# Patient Record
Sex: Male | Born: 1995 | Race: White | Hispanic: No | Marital: Single | Smoking: Never smoker
Health system: Southern US, Community
[De-identification: ages and names within clinical notes are randomized; demographics above are authoritative.]

## PROBLEM LIST (undated history)

## (undated) DIAGNOSIS — J45909 Unspecified asthma, uncomplicated: Secondary | ICD-10-CM

## (undated) DIAGNOSIS — J189 Pneumonia, unspecified organism: Secondary | ICD-10-CM

## (undated) HISTORY — PX: WISDOM TOOTH EXTRACTION: SHX21

---
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
## 2014-08-31 ENCOUNTER — Encounter (HOSPITAL_COMMUNITY): Payer: Self-pay | Admitting: *Deleted

## 2014-08-31 MED ORDER — CEFAZOLIN SODIUM-DEXTROSE 2-3 GM-% IV SOLR
2.0000 g | INTRAVENOUS | Status: AC
  Administered 2014-09-01: 2 g via INTRAVENOUS
  Filled 2014-08-31: qty 50

## 2014-08-31 MED ORDER — ACETAMINOPHEN 500 MG PO TABS: 1000.0000 mg | ORAL_TABLET | Freq: Once | ORAL | Status: DC

## 2014-08-31 MED ORDER — LACTATED RINGERS IV SOLN: INTRAVENOUS | Status: DC

## 2014-09-01 ENCOUNTER — Ambulatory Visit (HOSPITAL_COMMUNITY): Payer: PRIVATE HEALTH INSURANCE | Admitting: Certified Registered Nurse Anesthetist

## 2014-09-01 ENCOUNTER — Encounter (HOSPITAL_COMMUNITY): Payer: Self-pay

## 2014-09-01 ENCOUNTER — Ambulatory Visit (HOSPITAL_COMMUNITY)
Admission: RE | Admit: 2014-09-01 | Discharge: 2014-09-01 | Disposition: A | Discharge status: Home / Self Care | Payer: PRIVATE HEALTH INSURANCE | Source: Ambulatory Visit | Attending: Orthopedic Surgery | Admitting: Orthopedic Surgery

## 2014-09-01 ENCOUNTER — Ambulatory Visit (HOSPITAL_COMMUNITY): Payer: PRIVATE HEALTH INSURANCE

## 2014-09-01 ENCOUNTER — Encounter (HOSPITAL_COMMUNITY)
Admission: RE | Disposition: A | Discharge status: Home / Self Care | Payer: Self-pay | Source: Ambulatory Visit | Attending: Orthopedic Surgery

## 2014-09-01 DIAGNOSIS — X58XXXD Exposure to other specified factors, subsequent encounter: Secondary | ICD-10-CM | POA: Diagnosis not present

## 2014-09-01 DIAGNOSIS — S52201K Unspecified fracture of shaft of right ulna, subsequent encounter for closed fracture with nonunion: Secondary | ICD-10-CM | POA: Diagnosis present

## 2014-09-01 DIAGNOSIS — Z419 Encounter for procedure for purposes other than remedying health state, unspecified: Secondary | ICD-10-CM

## 2014-09-01 DIAGNOSIS — S52209A Unspecified fracture of shaft of unspecified ulna, initial encounter for closed fracture: Secondary | ICD-10-CM

## 2014-09-01 DIAGNOSIS — S52291K Other fracture of shaft of right ulna, subsequent encounter for closed fracture with nonunion: Secondary | ICD-10-CM | POA: Insufficient documentation

## 2014-09-01 HISTORY — PX: ORIF ULNAR FRACTURE: SHX5417

## 2014-09-01 HISTORY — DX: Unspecified asthma, uncomplicated: J45.909

## 2014-09-01 HISTORY — DX: Pneumonia, unspecified organism: J18.9

## 2014-09-01 LAB — CBC
HCT: 43.3 % (ref 39.0–52.0)
Hemoglobin: 14.3 g/dL (ref 13.0–17.0)
MCH: 26.5 pg (ref 26.0–34.0)
MCHC: 33 g/dL (ref 30.0–36.0)
MCV: 80.3 fL (ref 78.0–100.0)
PLATELETS: 265 10*3/uL (ref 150–400)
RBC: 5.39 MIL/uL (ref 4.22–5.81)
RDW: 13.4 % (ref 11.5–15.5)
WBC: 9.8 10*3/uL (ref 4.0–10.5)

## 2014-09-01 SURGERY — OPEN REDUCTION INTERNAL FIXATION (ORIF) ULNAR FRACTURE
Anesthesia: Regional | Site: Arm Lower | Laterality: Right

## 2014-09-01 MED ORDER — TRAMADOL HCL 50 MG PO TABS: 50.0000 mg | ORAL_TABLET | Freq: Four times a day (QID) | ORAL | Status: DC | PRN

## 2014-09-01 MED ORDER — PROPOFOL INFUSION 10 MG/ML OPTIME
INTRAVENOUS | Status: DC | PRN
  Administered 2014-09-01: 50 ug/kg/min via INTRAVENOUS

## 2014-09-01 MED ORDER — 0.9 % SODIUM CHLORIDE (POUR BTL) OPTIME
TOPICAL | Status: DC | PRN
  Administered 2014-09-01: 1000 mL

## 2014-09-01 MED ORDER — BUPIVACAINE-EPINEPHRINE (PF) 0.5% -1:200000 IJ SOLN
INTRAMUSCULAR | Status: DC | PRN
  Administered 2014-09-01: 25 mL via PERINEURAL

## 2014-09-01 MED ORDER — PROPOFOL 10 MG/ML IV BOLUS
INTRAVENOUS | Status: AC
  Filled 2014-09-01: qty 20

## 2014-09-01 MED ORDER — FENTANYL CITRATE (PF) 250 MCG/5ML IJ SOLN
INTRAMUSCULAR | Status: AC
  Filled 2014-09-01: qty 5

## 2014-09-01 MED ORDER — ACETAMINOPHEN 500 MG PO TABS: 500.0000 mg | ORAL_TABLET | Freq: Four times a day (QID) | ORAL | Status: DC | PRN

## 2014-09-01 MED ORDER — FENTANYL CITRATE (PF) 100 MCG/2ML IJ SOLN
INTRAMUSCULAR | Status: DC | PRN
  Administered 2014-09-01: 100 ug via INTRAVENOUS

## 2014-09-01 MED ORDER — PROMETHAZINE HCL 12.5 MG PO TABS: 12.5000 mg | ORAL_TABLET | Freq: Four times a day (QID) | ORAL | Status: DC | PRN

## 2014-09-01 MED ORDER — MIDAZOLAM HCL 5 MG/5ML IJ SOLN
INTRAMUSCULAR | Status: DC | PRN
  Administered 2014-09-01: 2 mg via INTRAVENOUS

## 2014-09-01 MED ORDER — CHLORHEXIDINE GLUCONATE 4 % EX LIQD: 60.0000 mL | Freq: Once | CUTANEOUS | Status: DC

## 2014-09-01 MED ORDER — LACTATED RINGERS IV SOLN
INTRAVENOUS | Status: DC | PRN
  Administered 2014-09-01 (×2): via INTRAVENOUS

## 2014-09-01 MED ORDER — ONDANSETRON HCL 4 MG/2ML IJ SOLN
INTRAMUSCULAR | Status: DC | PRN
  Administered 2014-09-01: 4 mg via INTRAVENOUS

## 2014-09-01 MED ORDER — MIDAZOLAM HCL 2 MG/2ML IJ SOLN
INTRAMUSCULAR | Status: AC
  Filled 2014-09-01: qty 2

## 2014-09-01 MED ORDER — OXYCODONE HCL 5 MG PO TABS: 5.0000 mg | ORAL_TABLET | Freq: Four times a day (QID) | ORAL | Status: DC | PRN

## 2014-09-01 SURGICAL SUPPLY — 67 items
BANDAGE ELASTIC 3 VELCRO ST LF (GAUZE/BANDAGES/DRESSINGS) ×2 IMPLANT
BANDAGE ELASTIC 4 VELCRO ST LF (GAUZE/BANDAGES/DRESSINGS) ×3 IMPLANT
BIT DRILL 2.5X110 QC LCP DISP (BIT) ×2 IMPLANT
BLADE SURG ROTATE 9660 (MISCELLANEOUS) ×3 IMPLANT
BNDG CMPR 9X4 STRL LF SNTH (GAUZE/BANDAGES/DRESSINGS) ×1
BNDG ESMARK 4X9 LF (GAUZE/BANDAGES/DRESSINGS) ×3 IMPLANT
BNDG GAUZE ELAST 4 BULKY (GAUZE/BANDAGES/DRESSINGS) ×2 IMPLANT
BRUSH SCRUB DISP (MISCELLANEOUS) ×6 IMPLANT
COVER SURGICAL LIGHT HANDLE (MISCELLANEOUS) ×6 IMPLANT
CUFF TOURNIQUET SINGLE 24IN (TOURNIQUET CUFF) ×2 IMPLANT
DECANTER SPIKE VIAL GLASS SM (MISCELLANEOUS) IMPLANT
DRAPE C-ARM 42X72 X-RAY (DRAPES) ×2 IMPLANT
DRAPE C-ARMOR (DRAPES) ×1 IMPLANT
DRAPE OEC MINIVIEW 54X84 (DRAPES) ×1 IMPLANT
DRAPE U-SHAPE 47X51 STRL (DRAPES) ×2 IMPLANT
DRSG ADAPTIC 3X8 NADH LF (GAUZE/BANDAGES/DRESSINGS) ×2 IMPLANT
DRSG EMULSION OIL 3X3 NADH (GAUZE/BANDAGES/DRESSINGS) ×1 IMPLANT
ELECT REM PT RETURN 9FT ADLT (ELECTROSURGICAL) ×3
ELECTRODE REM PT RTRN 9FT ADLT (ELECTROSURGICAL) ×1 IMPLANT
GAUZE SPONGE 4X4 12PLY STRL (GAUZE/BANDAGES/DRESSINGS) ×3 IMPLANT
GLOVE BIO SURGEON STRL SZ7.5 (GLOVE) ×3 IMPLANT
GLOVE BIO SURGEON STRL SZ8 (GLOVE) ×3 IMPLANT
GLOVE BIOGEL PI IND STRL 7.5 (GLOVE) ×1 IMPLANT
GLOVE BIOGEL PI IND STRL 8 (GLOVE) ×1 IMPLANT
GLOVE BIOGEL PI INDICATOR 7.5 (GLOVE) ×2
GLOVE BIOGEL PI INDICATOR 8 (GLOVE) ×2
GOWN STRL REUS W/ TWL LRG LVL3 (GOWN DISPOSABLE) ×2 IMPLANT
GOWN STRL REUS W/ TWL XL LVL3 (GOWN DISPOSABLE) ×1 IMPLANT
GOWN STRL REUS W/TWL LRG LVL3 (GOWN DISPOSABLE) ×6
GOWN STRL REUS W/TWL XL LVL3 (GOWN DISPOSABLE) ×3
KIT BASIN OR (CUSTOM PROCEDURE TRAY) ×3 IMPLANT
KIT ROOM TURNOVER OR (KITS) ×3 IMPLANT
MANIFOLD NEPTUNE II (INSTRUMENTS) ×1 IMPLANT
NDL HYPO 25GX1X1/2 BEV (NEEDLE) IMPLANT
NEEDLE HYPO 25GX1X1/2 BEV (NEEDLE) IMPLANT
NS IRRIG 1000ML POUR BTL (IV SOLUTION) ×3 IMPLANT
PACK ORTHO EXTREMITY (CUSTOM PROCEDURE TRAY) ×3 IMPLANT
PAD ARMBOARD 7.5X6 YLW CONV (MISCELLANEOUS) ×4 IMPLANT
PAD CAST 3X4 CTTN HI CHSV (CAST SUPPLIES) ×1 IMPLANT
PAD CAST 4YDX4 CTTN HI CHSV (CAST SUPPLIES) IMPLANT
PADDING CAST COTTON 3X4 STRL (CAST SUPPLIES) ×3
PADDING CAST COTTON 4X4 STRL (CAST SUPPLIES) ×6
PROS LCP PLATE 8H 111M (Plate) ×3 IMPLANT
PROSTHESIS LCP PLATE 8H 111M (Plate) IMPLANT
SCREW CORTEX 3.5 16MM (Screw) ×2 IMPLANT
SCREW CORTEX 3.5 20MM (Screw) ×10 IMPLANT
SCREW LOCK CORT ST 3.5X16 (Screw) IMPLANT
SCREW LOCK CORT ST 3.5X20 (Screw) IMPLANT
SPLINT PLASTER CAST XFAST 4X15 (CAST SUPPLIES) IMPLANT
SPLINT PLASTER XTRA FAST SET 4 (CAST SUPPLIES) ×2
SPONGE SCRUB IODOPHOR (GAUZE/BANDAGES/DRESSINGS) ×3 IMPLANT
STAPLER VISISTAT 35W (STAPLE) ×1 IMPLANT
SUCTION FRAZIER TIP 10 FR DISP (SUCTIONS) ×3 IMPLANT
SUT ETHILON 3 0 PS 1 (SUTURE) ×4 IMPLANT
SUT PROLENE 0 CT (SUTURE) IMPLANT
SUT VIC AB 0 CT1 27 (SUTURE) ×3
SUT VIC AB 0 CT1 27XBRD ANBCTR (SUTURE) IMPLANT
SUT VIC AB 2-0 CT1 27 (SUTURE) ×3
SUT VIC AB 2-0 CT1 TAPERPNT 27 (SUTURE) ×1 IMPLANT
SUT VIC AB 2-0 CT3 27 (SUTURE) IMPLANT
SYR CONTROL 10ML LL (SYRINGE) IMPLANT
TOWEL OR 17X24 6PK STRL BLUE (TOWEL DISPOSABLE) ×3 IMPLANT
TOWEL OR 17X26 10 PK STRL BLUE (TOWEL DISPOSABLE) ×6 IMPLANT
TUBE CONNECTING 12'X1/4 (SUCTIONS) ×1
TUBE CONNECTING 12X1/4 (SUCTIONS) ×2 IMPLANT
UNDERPAD 30X30 INCONTINENT (UNDERPADS AND DIAPERS) ×3 IMPLANT
WATER STERILE IRR 1000ML POUR (IV SOLUTION) ×1 IMPLANT

## 2014-09-01 NOTE — Anesthesia Preprocedure Evaluation (Signed)
Anesthesia Evaluation  Patient identified by MRN, date of birth, ID band Patient awake    Reviewed: Allergy & Precautions, NPO status , Patient's Chart, lab work & pertinent test results  Airway Mallampati: II  TM Distance: >3 FB Neck ROM: Full    Dental no notable dental hx.    Pulmonary asthma , pneumonia -,  breath sounds clear to auscultation  Pulmonary exam normal       Cardiovascular negative cardio ROS Normal cardiovascular examRhythm:Regular Rate:Normal     Neuro/Psych negative neurological ROS  negative psych ROS   GI/Hepatic negative GI ROS, Neg liver ROS,   Endo/Other  negative endocrine ROS  Renal/GU negative Renal ROS     Musculoskeletal negative musculoskeletal ROS (+)   Abdominal   Peds  Hematology negative hematology ROS (+)   Anesthesia Other Findings   Reproductive/Obstetrics negative OB ROS                             Anesthesia Physical Anesthesia Plan  ASA: II  Anesthesia Plan: General and Regional   Post-op Pain Management:    Induction: Intravenous  Airway Management Planned: LMA  Additional Equipment:   Intra-op Plan:   Post-operative Plan: Extubation in OR  Informed Consent: I have reviewed the patients History and Physical, chart, labs and discussed the procedure including the risks, benefits and alternatives for the proposed anesthesia with the patient or authorized representative who has indicated his/her understanding and acceptance.   Dental advisory given  Plan Discussed with: CRNA  Anesthesia Plan Comments:         Anesthesia Quick Evaluation

## 2014-09-01 NOTE — Brief Op Note (Signed)
09/01/2014  9:59 AM  PATIENT:  Craig Norman  19 y.o. male  PRE-OPERATIVE DIAGNOSIS:  RIGHT ULNAR NON UNION  POST-OPERATIVE DIAGNOSIS:  RIGHT ULNAR NON UNION  PROCEDURE:  Procedure(s): OPEN REDUCTION INTERNAL FIXATION (ORIF) RIGHT ULNAR FRACTURE (Right)  SURGEON:  Surgeon(s) and Role:    * Myrene Galas, MD - Primary  PHYSICIAN ASSISTANT: Montez Morita, PA-C  ANESTHESIA:   regional  I/O:  Total I/O In: 1000 [I.V.:1000] Out: 50 [Blood:50]  SPECIMEN:  No Specimen  TOURNIQUET:  None  DICTATION: .Other Dictation: Dictation Number [id number]

## 2014-09-01 NOTE — Anesthesia Procedure Notes (Signed)
Anesthesia Regional Block:  Supraclavicular block  Pre-Anesthetic Checklist: ,, timeout performed, Correct Patient, Correct Site, Correct Laterality, Correct Procedure, Correct Position, site marked, Risks and benefits discussed,  Surgical consent,  Pre-op evaluation,  At surgeon's request and post-op pain management  Laterality: Upper  Prep: chloraprep       Needles:  Injection technique: Single-shot  Needle Type: Echogenic Stimulator Needle          Additional Needles:  Procedures: ultrasound guided (picture in chart) Supraclavicular block Narrative:  Injection made incrementally with aspirations every 5 mL.  Performed by: Personally   Additional Notes: H+P and labs reviewed, risks and benefits discussed with patient, procedure tolerated well without complications

## 2014-09-01 NOTE — Transfer of Care (Signed)
Immediate Anesthesia Transfer of Care Note  Patient: Craig Norman  Procedure(s) Performed: Procedure(s): OPEN REDUCTION INTERNAL FIXATION (ORIF) RIGHT ULNAR FRACTURE (Right)  Patient Location: PACU  Anesthesia Type:MAC and Regional  Level of Consciousness: patient cooperative  Airway & Oxygen Therapy: Patient Spontanous Breathing and Patient connected to nasal cannula oxygen  Post-op Assessment: Report given to RN and Post -op Vital signs reviewed and stable  Post vital signs: Reviewed and stable  Last Vitals:  Filed Vitals:   09/01/14 0609  BP: 125/70  Pulse: 68  Temp: 36.6 C  Resp: 18    Complications: No apparent anesthesia complications

## 2014-09-01 NOTE — Anesthesia Postprocedure Evaluation (Signed)
  Anesthesia Post-op Note  Patient: Craig Norman  Procedure(s) Performed: Procedure(s): OPEN REDUCTION INTERNAL FIXATION (ORIF) RIGHT ULNAR FRACTURE (Right)  Patient Location: PACU  Anesthesia Type:MAC and Regional  Level of Consciousness: awake  Airway and Oxygen Therapy: Patient Spontanous Breathing  Post-op Pain: none  Post-op Assessment: Post-op Vital signs reviewed, Patient's Cardiovascular Status Stable, Respiratory Function Stable, Patent Airway, No signs of Nausea or vomiting and Pain level controlled              Post-op Vital Signs: Reviewed and stable  Last Vitals:  Filed Vitals:   09/01/14 1125  BP: 108/65  Pulse: 69  Temp: 36.2 C  Resp: 16    Complications: No apparent anesthesia complications

## 2014-09-01 NOTE — Progress Notes (Signed)
Report given to meagen rn as caregiver

## 2014-09-01 NOTE — H&P (Signed)
Orthopaedic Trauma Service H&P   Chief Complaint: symptomatic nonunion R ulna fracture HPI:   19 y/o RHD male sustained a closed fracture of his right ulna approximately 10 weeks ago.  Pt was seen at another orthopaedic office and treated nonoperatively. He was in a long arm cast for 2 weeks followed by removable brace for 2 weeks and then released for full activity. Pt has had unrelenting pain and weakness in his R arm. 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  Options were discussed with the pt and family and he decided to proceed with surgical intervention. Pt presents today for ORIF R ulna nonunion   Past Medical History  Diagnosis Date  . Asthma     exercise induced  . Pneumonia 2012ish    Past Surgical History  Procedure Laterality Date  . Wisdom tooth extraction      History reviewed. No pertinent family history. Social History:  reports that he has never smoked. He does not have any smokeless tobacco history on file. He reports that he does not drink alcohol or use illicit drugs.  Allergies: No Known Allergies  Medications Prior to Admission  Medication Sig Dispense Refill  . albuterol (PROVENTIL HFA;VENTOLIN HFA) 108 (90 BASE) MCG/ACT inhaler Inhale 1-2 puffs into the lungs every 6 (six) hours as needed for wheezing or shortness of breath.     . montelukast (SINGULAIR) 10 MG tablet Take 10 mg by mouth at bedtime as needed (allergies). Takes prn      Results for orders placed or performed during the hospital encounter of 09/01/14 (from the past 48 hour(s))  CBC     Status: None   Collection Time: 09/01/14  6:39 AM  Result Value Ref Range   WBC 9.8 4.0 - 10.5 K/uL   RBC 5.39 4.22 - 5.81 MIL/uL   Hemoglobin 14.3 13.0 - 17.0 g/dL   HCT 16.1 09.6 - 04.5 %   MCV 80.3 78.0 - 100.0 fL   MCH 26.5 26.0 - 34.0 pg   MCHC 33.0 30.0 - 36.0 g/dL   RDW 40.9 81.1 - 91.4 %   Platelets 265 150 - 400 K/uL   No  results found.  Review of Systems  Constitutional: Negative for fever and chills.  Eyes: Negative for blurred vision.  Respiratory: Negative for shortness of breath.   Cardiovascular: Negative for chest pain and palpitations.  Gastrointestinal: Negative for nausea, vomiting and abdominal pain.  Neurological: Negative for headaches.    Blood pressure 125/70, pulse 68, temperature 97.8 F (36.6 C), temperature source Oral, resp. rate 18, height  (1.702 m), weight 64.864 kg (143 lb), SpO2 100 %. Physical Exam  Constitutional: He is oriented to person, place, and time. He appears well-developed and well-nourished. He is cooperative. No distress.  HENT:  Head: Normocephalic and atraumatic.  Eyes: Pupils are equal, round, and reactive to light.  Neck: Normal range of motion.  Cardiovascular: Normal rate, regular rhythm, S1 normal and S2 normal.   No murmur heard. Respiratory: Effort normal and breath sounds normal. No respiratory distress. He has no wheezes.  GI: Soft. Bowel sounds are normal. He exhibits no distension.  Musculoskeletal:  R upper extremity    Full elbow, forearm and wrist ROM   + deformity over fracture site    TTP over fx site     No gross motion with manipulation of forearm at fracture site    Ext  warm     + radial pulse    R/U/M motor and sensory functions intact    AIN and PIN motor functions intact    Pain with MMT at fracture site as well    Hand and shoulder unremarkable   Neurological: He is alert and oriented to person, place, and time.  Skin: Skin is warm and dry. No rash noted.  Psychiatric: He has a normal mood and affect. His behavior is normal. Thought content normal.    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

## 2014-09-02 NOTE — Op Note (Signed)
NAME:  COTEY, HAM NO.:  [id number]  MEDICAL RECORD NO.:  [id number]  LOCATION:  [facility]                         FACILITY:  [facility]  PHYSICIAN:  Doralee Albino. Carola Frost, M.D. DATE OF BIRTH:  07/17/1995  DATE OF PROCEDURE:  09/01/2014 DATE OF DISCHARGE:  09/01/2014                              OPERATIVE REPORT   PREOPERATIVE DIAGNOSIS:  Right ulna, nonunion.  POSTOPERATIVE DIAGNOSIS:  Right ulna, nonunion.  PROCEDURE:  Repair of right ulna nonunion with compression plating and local autogenous bone grafting.  SURGEON:  Doralee Albino. Carola Frost, M.D.  ASSISTANT:  Mearl Latin, PA-C.  ANESTHESIA:  Supraclavicular block supplemented with MAC.  TOURNIQUET:  None.  COMPLICATIONS:  None.  ESTIMATED BLOOD LOSS:  Less than 50 mL.  DISPOSITION:  PACU.  CONDITION:  Stable.  BRIEF SUMMARY AND INDICATION FOR PROCEDURE:  Craig Norman is a very pleasant 19 year old right-hand dominant male, who sustained an ulnar shaft fracture in [employer] [facility] training.  He went on to develop a hypertrophic nonunion.  He had been followed by Dr. Dannielle Huh who felt that this would be best managed by fellowship trained orthopedic traumatologist and was outside of his regular practice and consequently, the patient sought definitive management with [place].  I discussed with him the risks and benefits of surgical repair, the options for allograft versus autograft and alternative means for fixation as well as the potential for symptomatic hardware infection, nerve injury, vessel injury, loss of motion, need for further surgery, persistent nonunion, many others.  The patient acknowledged these risks and did wish to proceed.  BRIEF SUMMARY OF PROCEDURE:  Lauchlan was given a supraclavicular block by Dr. Maple Hudson in preop holding with excellent relief.  He was then taken to the PACU, did receive 2 g of Ancef preoperatively.  His right upper extremity was prepped and draped in usual sterile fashion.   Tourniquet was placed about the arm, but never inflated during the procedure.  The ulna was then exposed through a subcutaneous border incision of approximately 7 cm.  I entered the volar compartment and was very careful to protect the muscle belly and to preserve as much periosteum as possible.  However, I did have to expose the volar aspect of the hypertrophic nonunion site.  I then used a 0.5 inch osteotome and 0.25 inch osteotome to remove a channel of bone and make this flushing congruent with the rest of the ulna, so that I could place a 3.5 LC-DCP plate from [employer].  This was secured into position with 1 screw and checked on AP and lateral projections to make sure, we were pleased with both the length and alignment.  We then placed a series of the screws and compression, drilling eccentrically and tightening on both sides of the fracture for double compression.  Final images showed appropriate reduction, hardware length and trajectory.  The bone which had been chiseled a way was morselized using the cancellous portions or callus, and then were placed into the area of nonunion and around it.  There were no large chiasm or significant diastasis.  I did probe the fracture quite aggressively with a 15 blade and it was really about this width that  the nonunion persisted.  This was the course primary area of graft placement as well.  The fascia was left open, but tacked to approximate and then the subcu closed with 2-0 Vicryl and 3-0 nylon used for the skin.  Sterile gentle compressive dressing and volar splint from the elbow and past the wrist was applied.  The patient was awakened from anesthesia, transported to PACU in stable condition.  Montez Morita, PA-C did assist me throughout and was necessary for retraction and hardware placement and also assisted with closure.  PROGNOSIS:  Neno will be in the splint.  He will return to the office in 10 days for removal of his sutures and  splint.  At that time, we do not anticipate any further bracing.  He will be allowed to return to play and contact at 6 weeks pending examination and x-rays.  Although, ulnar plates are frequently symptomatic following repair, we are hopeful that the volar placement will mitigate the need for removal, but in this young patient certainly feasible.     Doralee Albino. Carola Frost, M.D.     MHH/MEDQ  D:  09/01/2014  T:  09/02/2014  Job:  [id number]

## 2014-09-04 ENCOUNTER — Encounter (HOSPITAL_COMMUNITY): Payer: Self-pay | Admitting: Orthopedic Surgery

## 2015-01-29 ENCOUNTER — Encounter (HOSPITAL_COMMUNITY): Payer: Self-pay | Admitting: *Deleted

## 2015-01-29 MED ORDER — CEFAZOLIN SODIUM-DEXTROSE 2-3 GM-% IV SOLR
2.0000 g | INTRAVENOUS | Status: AC
  Administered 2015-01-30: 2 g via INTRAVENOUS
  Filled 2015-01-29: qty 50
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
## 2015-01-29 NOTE — Anesthesia Preprocedure Evaluation (Signed)
Anesthesia Evaluation  Patient identified by MRN, date of birth, ID band Patient awake    Reviewed: Allergy & Precautions, NPO status , Patient's Chart, lab work & pertinent test results  Airway Mallampati: II  TM Distance: >3 FB Neck ROM: Full    Dental no notable dental hx.    Pulmonary asthma , pneumonia,    Pulmonary exam normal breath sounds clear to auscultation       Cardiovascular negative cardio ROS Normal cardiovascular exam Rhythm:Regular Rate:Normal     Neuro/Psych negative neurological ROS  negative psych ROS   GI/Hepatic negative GI ROS, Neg liver ROS,   Endo/Other  negative endocrine ROS  Renal/GU negative Renal ROS     Musculoskeletal negative musculoskeletal ROS (+)   Abdominal   Peds  Hematology negative hematology ROS (+)   Anesthesia Other Findings   Reproductive/Obstetrics negative OB ROS                             Anesthesia Physical  Anesthesia Plan  ASA: II  Anesthesia Plan: General and Regional   Post-op Pain Management:    Induction: Intravenous  Airway Management Planned: LMA  Additional Equipment:   Intra-op Plan:   Post-operative Plan: Extubation in OR  Informed Consent: I have reviewed the patients History and Physical, chart, labs and discussed the procedure including the risks, benefits and alternatives for the proposed anesthesia with the patient or authorized representative who has indicated his/her understanding and acceptance.   Dental advisory given  Plan Discussed with: CRNA  Anesthesia Plan Comments:         Anesthesia Quick Evaluation

## 2015-01-29 NOTE — Progress Notes (Signed)
Called Dr. Magdalene PatriciaHandy's office for pre-op orders. Left message on office voicemail

## 2015-01-30 ENCOUNTER — Ambulatory Visit (HOSPITAL_COMMUNITY)
Admission: RE | Admit: 2015-01-30 | Discharge: 2015-01-30 | Disposition: A | Discharge status: Home / Self Care | Payer: PRIVATE HEALTH INSURANCE | Source: Ambulatory Visit | Attending: Orthopedic Surgery | Admitting: Orthopedic Surgery

## 2015-01-30 ENCOUNTER — Ambulatory Visit (HOSPITAL_COMMUNITY): Payer: PRIVATE HEALTH INSURANCE

## 2015-01-30 ENCOUNTER — Encounter (HOSPITAL_COMMUNITY)
Admission: RE | Disposition: A | Discharge status: Home / Self Care | Payer: Self-pay | Source: Ambulatory Visit | Attending: Orthopedic Surgery

## 2015-01-30 ENCOUNTER — Encounter (HOSPITAL_COMMUNITY): Payer: Self-pay | Admitting: Anesthesiology

## 2015-01-30 ENCOUNTER — Ambulatory Visit (HOSPITAL_COMMUNITY): Payer: PRIVATE HEALTH INSURANCE | Admitting: Anesthesiology

## 2015-01-30 DIAGNOSIS — Y9389 Activity, other specified: Secondary | ICD-10-CM | POA: Diagnosis not present

## 2015-01-30 DIAGNOSIS — T148XXA Other injury of unspecified body region, initial encounter: Secondary | ICD-10-CM

## 2015-01-30 DIAGNOSIS — Y838 Other surgical procedures as the cause of abnormal reaction of the patient, or of later complication, without mention of misadventure at the time of the procedure: Secondary | ICD-10-CM | POA: Insufficient documentation

## 2015-01-30 DIAGNOSIS — M96631 Fracture of radius or ulna following insertion of orthopedic implant, joint prosthesis, or bone plate, right arm: Secondary | ICD-10-CM | POA: Insufficient documentation

## 2015-01-30 DIAGNOSIS — J45909 Unspecified asthma, uncomplicated: Secondary | ICD-10-CM | POA: Diagnosis not present

## 2015-01-30 DIAGNOSIS — Z419 Encounter for procedure for purposes other than remedying health state, unspecified: Secondary | ICD-10-CM

## 2015-01-30 DIAGNOSIS — W500XXA Accidental hit or strike by another person, initial encounter: Secondary | ICD-10-CM | POA: Insufficient documentation

## 2015-01-30 HISTORY — PX: HARDWARE REMOVAL: SHX979

## 2015-01-30 HISTORY — PX: ORIF ULNAR FRACTURE: SHX5417

## 2015-01-30 LAB — PREALBUMIN: PREALBUMIN: 27.6 mg/dL (ref 18–38)

## 2015-01-30 LAB — CBC WITH DIFFERENTIAL/PLATELET
BASOS ABS: 0 10*3/uL (ref 0.0–0.1)
BASOS PCT: 0 %
EOS ABS: 0.2 10*3/uL (ref 0.0–0.7)
Eosinophils Relative: 2 %
HCT: 38.6 % — ABNORMAL LOW (ref 39.0–52.0)
Hemoglobin: 12.6 g/dL — ABNORMAL LOW (ref 13.0–17.0)
Lymphocytes Relative: 39 %
Lymphs Abs: 2.9 10*3/uL (ref 0.7–4.0)
MCH: 26.2 pg (ref 26.0–34.0)
MCHC: 32.6 g/dL (ref 30.0–36.0)
MCV: 80.2 fL (ref 78.0–100.0)
MONOS PCT: 10 %
Monocytes Absolute: 0.8 10*3/uL (ref 0.1–1.0)
NEUTROS PCT: 49 %
Neutro Abs: 3.5 10*3/uL (ref 1.7–7.7)
PLATELETS: 244 10*3/uL (ref 150–400)
RBC: 4.81 MIL/uL (ref 4.22–5.81)
RDW: 13 % (ref 11.5–15.5)
WBC: 7.4 10*3/uL (ref 4.0–10.5)

## 2015-01-30 LAB — COMPREHENSIVE METABOLIC PANEL
ALK PHOS: 56 U/L (ref 38–126)
ALT: 19 U/L (ref 17–63)
AST: 29 U/L (ref 15–41)
Albumin: 4 g/dL (ref 3.5–5.0)
Anion gap: 9 (ref 5–15)
BILIRUBIN TOTAL: 0.5 mg/dL (ref 0.3–1.2)
BUN: 17 mg/dL (ref 6–20)
CALCIUM: 9 mg/dL (ref 8.9–10.3)
CO2: 23 mmol/L (ref 22–32)
CREATININE: 1.16 mg/dL (ref 0.61–1.24)
Chloride: 106 mmol/L (ref 101–111)
Glucose, Bld: 83 mg/dL (ref 65–99)
Potassium: 3.6 mmol/L (ref 3.5–5.1)
SODIUM: 138 mmol/L (ref 135–145)
TOTAL PROTEIN: 6.5 g/dL (ref 6.5–8.1)

## 2015-01-30 LAB — TSH: TSH: 3.79 u[IU]/mL (ref 0.350–4.500)

## 2015-01-30 LAB — PHOSPHORUS: PHOSPHORUS: 5.2 mg/dL — AB (ref 2.5–4.6)

## 2015-01-30 LAB — MAGNESIUM: MAGNESIUM: 2 mg/dL (ref 1.7–2.4)

## 2015-01-30 SURGERY — OPEN REDUCTION INTERNAL FIXATION (ORIF) ULNAR FRACTURE
Anesthesia: Regional | Site: Arm Lower | Laterality: Right

## 2015-01-30 MED ORDER — DEXAMETHASONE SODIUM PHOSPHATE 4 MG/ML IJ SOLN
INTRAMUSCULAR | Status: AC
  Filled 2015-01-30: qty 2

## 2015-01-30 MED ORDER — OXYCODONE-ACETAMINOPHEN 5-325 MG PO TABS: 1.0000 | ORAL_TABLET | Freq: Four times a day (QID) | ORAL | Status: AC | PRN

## 2015-01-30 MED ORDER — FENTANYL CITRATE (PF) 100 MCG/2ML IJ SOLN
INTRAMUSCULAR | Status: DC | PRN
  Administered 2015-01-30: 25 ug via INTRAVENOUS
  Administered 2015-01-30: 50 ug via INTRAVENOUS

## 2015-01-30 MED ORDER — FENTANYL CITRATE (PF) 100 MCG/2ML IJ SOLN
INTRAMUSCULAR | Status: AC
  Filled 2015-01-30: qty 2

## 2015-01-30 MED ORDER — BUPIVACAINE-EPINEPHRINE (PF) 0.5% -1:200000 IJ SOLN
INTRAMUSCULAR | Status: DC | PRN
  Administered 2015-01-30: 30 mL via PERINEURAL

## 2015-01-30 MED ORDER — MEPERIDINE HCL 25 MG/ML IJ SOLN
6.2500 mg | INTRAMUSCULAR | Status: DC | PRN
Start: 2015-01-30 — End: 2015-01-30

## 2015-01-30 MED ORDER — LIDOCAINE HCL (CARDIAC) 20 MG/ML IV SOLN
INTRAVENOUS | Status: AC
  Filled 2015-01-30: qty 5

## 2015-01-30 MED ORDER — OXYCODONE HCL 5 MG PO TABS: 5.0000 mg | ORAL_TABLET | Freq: Four times a day (QID) | ORAL | Status: AC | PRN

## 2015-01-30 MED ORDER — MIDAZOLAM HCL 2 MG/2ML IJ SOLN
INTRAMUSCULAR | Status: AC
Start: 2015-01-30 — End: 2015-01-30
  Filled 2015-01-30: qty 4

## 2015-01-30 MED ORDER — DEXAMETHASONE SODIUM PHOSPHATE 4 MG/ML IJ SOLN
INTRAMUSCULAR | Status: DC | PRN
  Administered 2015-01-30: 4 mg via INTRAVENOUS

## 2015-01-30 MED ORDER — PROPOFOL 10 MG/ML IV BOLUS
INTRAVENOUS | Status: DC | PRN
  Administered 2015-01-30: 200 mg via INTRAVENOUS

## 2015-01-30 MED ORDER — CHLORHEXIDINE GLUCONATE 4 % EX LIQD: 60.0000 mL | Freq: Once | CUTANEOUS | Status: DC

## 2015-01-30 MED ORDER — FENTANYL CITRATE (PF) 250 MCG/5ML IJ SOLN
INTRAMUSCULAR | Status: AC
  Filled 2015-01-30: qty 5

## 2015-01-30 MED ORDER — LIDOCAINE HCL (CARDIAC) 20 MG/ML IV SOLN
INTRAVENOUS | Status: DC | PRN
  Administered 2015-01-30: 60 mg via INTRAVENOUS

## 2015-01-30 MED ORDER — ONDANSETRON HCL 4 MG PO TABS: 4.0000 mg | ORAL_TABLET | Freq: Three times a day (TID) | ORAL | Status: AC | PRN

## 2015-01-30 MED ORDER — ROCURONIUM BROMIDE 50 MG/5ML IV SOLN
INTRAVENOUS | Status: AC
  Filled 2015-01-30: qty 1

## 2015-01-30 MED ORDER — LACTATED RINGERS IV SOLN
INTRAVENOUS | Status: DC
Start: 2015-01-30 — End: 2015-01-30
  Administered 2015-01-30 (×2): via INTRAVENOUS

## 2015-01-30 MED ORDER — MIDAZOLAM HCL 5 MG/5ML IJ SOLN
INTRAMUSCULAR | Status: DC | PRN
  Administered 2015-01-30: 2 mg via INTRAVENOUS

## 2015-01-30 MED ORDER — HYDROMORPHONE HCL 1 MG/ML IJ SOLN: 0.2500 mg | INTRAMUSCULAR | Status: DC | PRN

## 2015-01-30 MED ORDER — PROMETHAZINE HCL 25 MG/ML IJ SOLN: 6.2500 mg | INTRAMUSCULAR | Status: DC | PRN

## 2015-01-30 MED ORDER — PROPOFOL 10 MG/ML IV BOLUS
INTRAVENOUS | Status: AC
Start: 2015-01-30 — End: 2015-01-30
  Filled 2015-01-30: qty 20

## 2015-01-30 MED ORDER — MIDAZOLAM HCL 2 MG/2ML IJ SOLN
INTRAMUSCULAR | Status: AC
  Filled 2015-01-30: qty 2

## 2015-01-30 MED ORDER — ONDANSETRON HCL 4 MG/2ML IJ SOLN
INTRAMUSCULAR | Status: AC
  Filled 2015-01-30: qty 2

## 2015-01-30 MED ORDER — ONDANSETRON HCL 4 MG/2ML IJ SOLN
INTRAMUSCULAR | Status: DC | PRN
  Administered 2015-01-30 (×2): 4 mg via INTRAVENOUS

## 2015-01-30 SURGICAL SUPPLY — 86 items
BANDAGE ELASTIC 3 VELCRO ST LF (GAUZE/BANDAGES/DRESSINGS) ×2 IMPLANT
BANDAGE ELASTIC 4 VELCRO ST LF (GAUZE/BANDAGES/DRESSINGS) ×3 IMPLANT
BANDAGE ELASTIC 6 VELCRO ST LF (GAUZE/BANDAGES/DRESSINGS) ×1 IMPLANT
BANDAGE ESMARK 6X9 LF (GAUZE/BANDAGES/DRESSINGS) ×1 IMPLANT
BIT DRILL 2.8 QR W/DEPTH MARKS (BIT) ×2 IMPLANT
BLADE SURG ROTATE 9660 (MISCELLANEOUS) ×3 IMPLANT
BNDG CMPR 9X4 STRL LF SNTH (GAUZE/BANDAGES/DRESSINGS) ×1
BNDG CMPR 9X6 STRL LF SNTH (GAUZE/BANDAGES/DRESSINGS) ×1
BNDG COHESIVE 6X5 TAN STRL LF (GAUZE/BANDAGES/DRESSINGS) ×3 IMPLANT
BNDG ESMARK 4X9 LF (GAUZE/BANDAGES/DRESSINGS) ×3 IMPLANT
BNDG ESMARK 6X9 LF (GAUZE/BANDAGES/DRESSINGS) ×3
BNDG GAUZE ELAST 4 BULKY (GAUZE/BANDAGES/DRESSINGS) ×4 IMPLANT
BRUSH SCRUB DISP (MISCELLANEOUS) ×6 IMPLANT
CLEANER TIP ELECTROSURG 2X2 (MISCELLANEOUS) ×3 IMPLANT
CLOSURE WOUND 1/2 X4 (GAUZE/BANDAGES/DRESSINGS)
COVER SURGICAL LIGHT HANDLE (MISCELLANEOUS) ×6 IMPLANT
CUFF TOURNIQUET SINGLE 18IN (TOURNIQUET CUFF) IMPLANT
CUFF TOURNIQUET SINGLE 24IN (TOURNIQUET CUFF) IMPLANT
CUFF TOURNIQUET SINGLE 34IN LL (TOURNIQUET CUFF) IMPLANT
DECANTER SPIKE VIAL GLASS SM (MISCELLANEOUS) IMPLANT
DRAPE C-ARM 42X72 X-RAY (DRAPES) ×2 IMPLANT
DRAPE C-ARMOR (DRAPES) ×1 IMPLANT
DRAPE OEC MINIVIEW 54X84 (DRAPES) ×1 IMPLANT
DRAPE U-SHAPE 47X51 STRL (DRAPES) ×3 IMPLANT
DRSG ADAPTIC 3X8 NADH LF (GAUZE/BANDAGES/DRESSINGS) ×1 IMPLANT
DRSG EMULSION OIL 3X3 NADH (GAUZE/BANDAGES/DRESSINGS) ×1 IMPLANT
ELECT REM PT RETURN 9FT ADLT (ELECTROSURGICAL) ×3
ELECTRODE REM PT RTRN 9FT ADLT (ELECTROSURGICAL) ×1 IMPLANT
EVACUATOR 1/8 PVC DRAIN (DRAIN) IMPLANT
GAUZE SPONGE 4X4 12PLY STRL (GAUZE/BANDAGES/DRESSINGS) ×3 IMPLANT
GLOVE BIO SURGEON STRL SZ7.5 (GLOVE) ×3 IMPLANT
GLOVE BIO SURGEON STRL SZ8 (GLOVE) ×3 IMPLANT
GLOVE BIOGEL PI IND STRL 6.5 (GLOVE) IMPLANT
GLOVE BIOGEL PI IND STRL 7.5 (GLOVE) ×1 IMPLANT
GLOVE BIOGEL PI IND STRL 8 (GLOVE) ×1 IMPLANT
GLOVE BIOGEL PI INDICATOR 6.5 (GLOVE) ×4
GLOVE BIOGEL PI INDICATOR 7.5 (GLOVE) ×2
GLOVE BIOGEL PI INDICATOR 8 (GLOVE) ×2
GLOVE SURG SS PI 6.5 STRL IVOR (GLOVE) ×2 IMPLANT
GOWN STRL REUS W/ TWL LRG LVL3 (GOWN DISPOSABLE) ×2 IMPLANT
GOWN STRL REUS W/ TWL XL LVL3 (GOWN DISPOSABLE) ×1 IMPLANT
GOWN STRL REUS W/TWL LRG LVL3 (GOWN DISPOSABLE) ×6
GOWN STRL REUS W/TWL XL LVL3 (GOWN DISPOSABLE) ×3
KIT BASIN OR (CUSTOM PROCEDURE TRAY) ×3 IMPLANT
KIT ROOM TURNOVER OR (KITS) ×3 IMPLANT
MANIFOLD NEPTUNE II (INSTRUMENTS) ×3 IMPLANT
NDL HYPO 25GX1X1/2 BEV (NEEDLE) IMPLANT
NEEDLE 22X1 1/2 (OR ONLY) (NEEDLE) IMPLANT
NEEDLE HYPO 25GX1X1/2 BEV (NEEDLE) IMPLANT
NS IRRIG 1000ML POUR BTL (IV SOLUTION) ×3 IMPLANT
PACK ORTHO EXTREMITY (CUSTOM PROCEDURE TRAY) ×3 IMPLANT
PAD ARMBOARD 7.5X6 YLW CONV (MISCELLANEOUS) ×2 IMPLANT
PAD CAST 3X4 CTTN HI CHSV (CAST SUPPLIES) ×1 IMPLANT
PADDING CAST COTTON 3X4 STRL (CAST SUPPLIES) ×6
PADDING CAST COTTON 6X4 STRL (CAST SUPPLIES) ×3 IMPLANT
PLATE ULNA 12 HOLE (Plate) ×2 IMPLANT
SCREW CORTICAL 3.5X20MM (Screw) ×2 IMPLANT
SCREW HEXALOBE NON-LOCK 3.5X14 (Screw) ×2 IMPLANT
SCREW HEXALOBE NON-LOCK 3.5X16 (Screw) ×2 IMPLANT
SCREW NON LOCKING HEX 3.5X18MM (Screw) ×2 IMPLANT
SCREW NONLOCK HEX 3.5X12 (Screw) ×2 IMPLANT
SPLINT PLASTER CAST XFAST 4X15 (CAST SUPPLIES) IMPLANT
SPLINT PLASTER XTRA FAST SET 4 (CAST SUPPLIES) ×20
SPONGE LAP 18X18 X RAY DECT (DISPOSABLE) ×3 IMPLANT
SPONGE SCRUB IODOPHOR (GAUZE/BANDAGES/DRESSINGS) ×3 IMPLANT
STAPLER VISISTAT 35W (STAPLE) ×1 IMPLANT
STOCKINETTE IMPERVIOUS LG (DRAPES) ×3 IMPLANT
STRIP CLOSURE SKIN 1/2X4 (GAUZE/BANDAGES/DRESSINGS) IMPLANT
SUCTION FRAZIER TIP 10 FR DISP (SUCTIONS) ×3 IMPLANT
SUT ETHILON 3 0 PS 1 (SUTURE) ×4 IMPLANT
SUT PDS AB 2-0 CT1 27 (SUTURE) IMPLANT
SUT PROLENE 0 CT (SUTURE) IMPLANT
SUT VIC AB 0 CT1 27 (SUTURE)
SUT VIC AB 0 CT1 27XBRD ANBCTR (SUTURE) IMPLANT
SUT VIC AB 2-0 CT1 27 (SUTURE) ×6
SUT VIC AB 2-0 CT1 TAPERPNT 27 (SUTURE) ×1 IMPLANT
SUT VIC AB 2-0 CT3 27 (SUTURE) IMPLANT
SYR CONTROL 10ML LL (SYRINGE) IMPLANT
TOWEL OR 17X24 6PK STRL BLUE (TOWEL DISPOSABLE) ×2 IMPLANT
TOWEL OR 17X26 10 PK STRL BLUE (TOWEL DISPOSABLE) ×6 IMPLANT
TUBE CONNECTING 12'X1/4 (SUCTIONS) ×1
TUBE CONNECTING 12X1/4 (SUCTIONS) ×2 IMPLANT
UNDERPAD 30X30 INCONTINENT (UNDERPADS AND DIAPERS) ×3 IMPLANT
WATER STERILE IRR 1000ML POUR (IV SOLUTION) ×2 IMPLANT
WIRE TACK PLATE PL-PTACK (WIRE) ×4 IMPLANT
YANKAUER SUCT BULB TIP NO VENT (SUCTIONS) ×3 IMPLANT
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
## 2015-01-30 NOTE — Transfer of Care (Signed)
Immediate Anesthesia Transfer of Care Note  Patient: Craig Norman  Procedure(s) Performed: Procedure(s): OPEN REDUCTION INTERNAL FIXATION (ORIF) RIGHT ULNA FRACTURE (Right) HARDWARE REMOVAL RIGHT ULNA  (Right)  Patient Location: PACU  Anesthesia Type:GA combined with regional for post-op pain  Level of Consciousness: awake, alert , oriented and patient cooperative  Airway & Oxygen Therapy: Patient Spontanous Breathing and Patient connected to nasal cannula oxygen  Post-op Assessment: Report given to RN and Post -op Vital signs reviewed and stable  Post vital signs: Reviewed and stable  Last Vitals:  Filed Vitals:   01/30/15 0619  BP: 130/76  Pulse: 62  Temp: 36.1 C  Resp: 16    Complications: No apparent anesthesia complications

## 2015-01-30 NOTE — Progress Notes (Signed)
Orthopedic Tech Progress Note Patient Details:  Craig Norman 7/11/Craig Heinrich1997 [id number]  Ortho Devices Type of Ortho Device: Arm sling Ortho Device/Splint Interventions: Application   Craig FordyceJennifer C Yarelis Norman 01/30/2015, 11:06 AM

## 2015-01-30 NOTE — Anesthesia Procedure Notes (Signed)
Anesthesia Regional Block:  Axillary brachial plexus block  Pre-Anesthetic Checklist: ,, timeout performed, Correct Patient, Correct Site, Correct Laterality, Correct Procedure, Correct Position, site marked, Risks and benefits discussed, Surgical consent,  Pre-op evaluation,  Post-op pain management  Laterality: Right  Prep: chloraprep       Needles:  Injection technique: Single-shot  Needle Type: Stimiplex          Additional Needles:  Procedures: ultrasound guided (picture in chart) Axillary brachial plexus block Narrative:   Performed by: Personally  Anesthesiologist: Lewie LoronGERMEROTH, Dametra Whetsel  Additional Notes: Patient tolerated the procedure well without complications

## 2015-01-30 NOTE — Discharge Instructions (Signed)
[facility] Discharge Instructions   General Discharge Instructions  WEIGHT BEARING STATUS: No lifting, pulling, punching with R arm  RANGE OF MOTION/ACTIVITY: range of motion as tolerated.  No using Right arm for anything other than feeding or writing until follow up visit  Wound Care: do not remove splint. Keep splint clean and dry   PAIN MEDICATION USE AND EXPECTATIONS  You have likely been given narcotic medications to help control your pain.  After a traumatic event that results in an fracture (broken bone) with or without surgery, it is ok to use narcotic pain medications to help control one's pain.  We understand that everyone responds to pain differently and each individual patient will be evaluated on a regular basis for the continued need for narcotic medications. Ideally, narcotic medication use should last no more than 6-8 weeks (coinciding with fracture healing).   As a patient it is your responsibility as well to monitor narcotic medication use and report the amount and frequency you use these medications when you come to your office visit.   We would also advise that if you are using narcotic medications, you should take a dose prior to therapy to maximize you participation.  IF YOU ARE ON NARCOTIC MEDICATIONS IT IS NOT PERMISSIBLE TO OPERATE A MOTOR VEHICLE (MOTORCYCLE/CAR/TRUCK/MOPED) OR HEAVY MACHINERY DO NOT MIX NARCOTICS WITH OTHER CNS (CENTRAL NERVOUS SYSTEM) DEPRESSANTS SUCH AS ALCOHOL  Diet: as you were eating previously.  Can use over the counter stool softeners and bowel preparations, such as Miralax, to help with bowel movements.  Narcotics can be constipating.  Be sure to drink plenty of fluids    STOP SMOKING OR USING NICOTINE PRODUCTS!!!!  As discussed nicotine severely impairs your body's ability to heal surgical and traumatic wounds but also impairs bone healing.  Wounds and bone heal by forming microscopic blood vessels (angiogenesis) and  nicotine is a vasoconstrictor (essentially, shrinks blood vessels).  Therefore, if vasoconstriction occurs to these microscopic blood vessels they essentially disappear and are unable to deliver necessary nutrients to the healing tissue.  This is one modifiable factor that you can do to dramatically increase your chances of healing your injury.    (This means no smoking, no nicotine gum, patches, etc)  DO NOT USE NONSTEROIDAL ANTI-INFLAMMATORY DRUGS (NSAID'S)  Using products such as Advil (ibuprofen), Aleve (naproxen), Motrin (ibuprofen) for additional pain control during fracture healing can delay and/or prevent the healing response.  If you would like to take over the counter (OTC) medication, Tylenol (acetaminophen) is ok.  However, some narcotic medications that are given for pain control contain acetaminophen as well. Therefore, you should not exceed more than 4000 mg of tylenol in a day if you do not have liver disease.  Also note that there are may OTC medicines, such as cold medicines and allergy medicines that my contain tylenol as well.  If you have any questions about medications and/or interactions please ask your doctor/PA or your pharmacist.      ICE AND ELEVATE INJURED/OPERATIVE EXTREMITY  Using ice and elevating the injured extremity above your heart can help with swelling and pain control.  Icing in a pulsatile fashion, such as 20 minutes on and 20 minutes off, can be followed.    Do not place ice directly on skin. Make sure there is a barrier between to skin and the ice pack.    Using frozen items such as frozen peas works well as the conform nicely to the are that needs to  be iced.  USE AN ACE WRAP OR TED HOSE FOR SWELLING CONTROL  In addition to icing and elevation, Ace wraps or TED hose are used to help limit and resolve swelling.  It is recommended to use Ace wraps or TED hose until you are informed to stop.    When using Ace Wraps start the wrapping distally (farthest away from  the body) and wrap proximally (closer to the body)   Example: If you had surgery on your leg or thing and you do not have a splint on, start the ace wrap at the toes and work your way up to the thigh        If you had surgery on your upper extremity and do not have a splint on, start the ace wrap at your fingers and work your way up to the upper arm  IF YOU ARE IN A SPLINT OR CAST DO NOT REMOVE IT FOR ANY REASON   If your splint gets wet for any reason please contact the office immediately. You may shower in your splint or cast as long as you keep it dry.  This can be done by wrapping in a cast cover or garbage back (or similar)  Do Not stick any thing down your splint or cast such as pencils, money, or hangers to try and scratch yourself with.  If you feel itchy take benadryl as prescribed on the bottle for itching  IF YOU ARE IN A CAM BOOT (BLACK BOOT)  You may remove boot periodically. Perform daily dressing changes as noted below.  Wash the liner of the boot regularly and wear a sock when wearing the boot. It is recommended that you sleep in the boot until told otherwise  CALL THE OFFICE WITH ANY QUESTIONS OR CONCERTS: [phone]

## 2015-01-30 NOTE — Anesthesia Postprocedure Evaluation (Signed)
Anesthesia Post Note  Patient: Craig Norman  Procedure(s) Performed: Procedure(s) (LRB): OPEN REDUCTION INTERNAL FIXATION (ORIF) RIGHT ULNA FRACTURE (Right) HARDWARE REMOVAL RIGHT ULNA  (Right)  Anesthesia type: General  Patient location: PACU  Post pain: Pain level controlled  Post assessment: Post-op Vital signs reviewed  Last Vitals: BP 133/86 mmHg  Pulse 65  Temp(Src) 36.4 C (Oral)  Resp 15  Ht 5\' 7"  (1.702 m)  Wt 138 lb (62.596 kg)  BMI 21.61 kg/m2  SpO2 100%  Post vital signs: Reviewed  Level of consciousness: sedated  Complications: No apparent anesthesia complications

## 2015-01-30 NOTE — Progress Notes (Signed)
Report given to mark rn as caregiver 

## 2015-01-31 ENCOUNTER — Encounter (HOSPITAL_COMMUNITY): Payer: Self-pay | Admitting: Orthopedic Surgery

## 2015-01-31 LAB — PTH, INTACT AND CALCIUM
Calcium, Total (PTH): 8.4 mg/dL — ABNORMAL LOW (ref 8.7–10.2)
PTH: 37 pg/mL (ref 15–65)

## 2015-01-31 LAB — VITAMIN D 25 HYDROXY (VIT D DEFICIENCY, FRACTURES): Vit D, 25-Hydroxy: 17.2 ng/mL — ABNORMAL LOW (ref 30.0–100.0)

## 2015-01-31 NOTE — Progress Notes (Signed)
Unhappy with phlebotomist in pre-op area. Stated she was not compassionate, rough with technique, caused a vein rupture and bruising , then had to have other phlebotomist come draw blood, which she did without issue

## 2015-10-20 IMAGING — CR DG FOREARM 2V*R*
2 series · 2 of 2 positions shown · non-contrast
Comparison: Operative images [id number]

CLINICAL DATA: Post ORIF

EXAM:
RIGHT FOREARM - 2 VIEW

[AP]
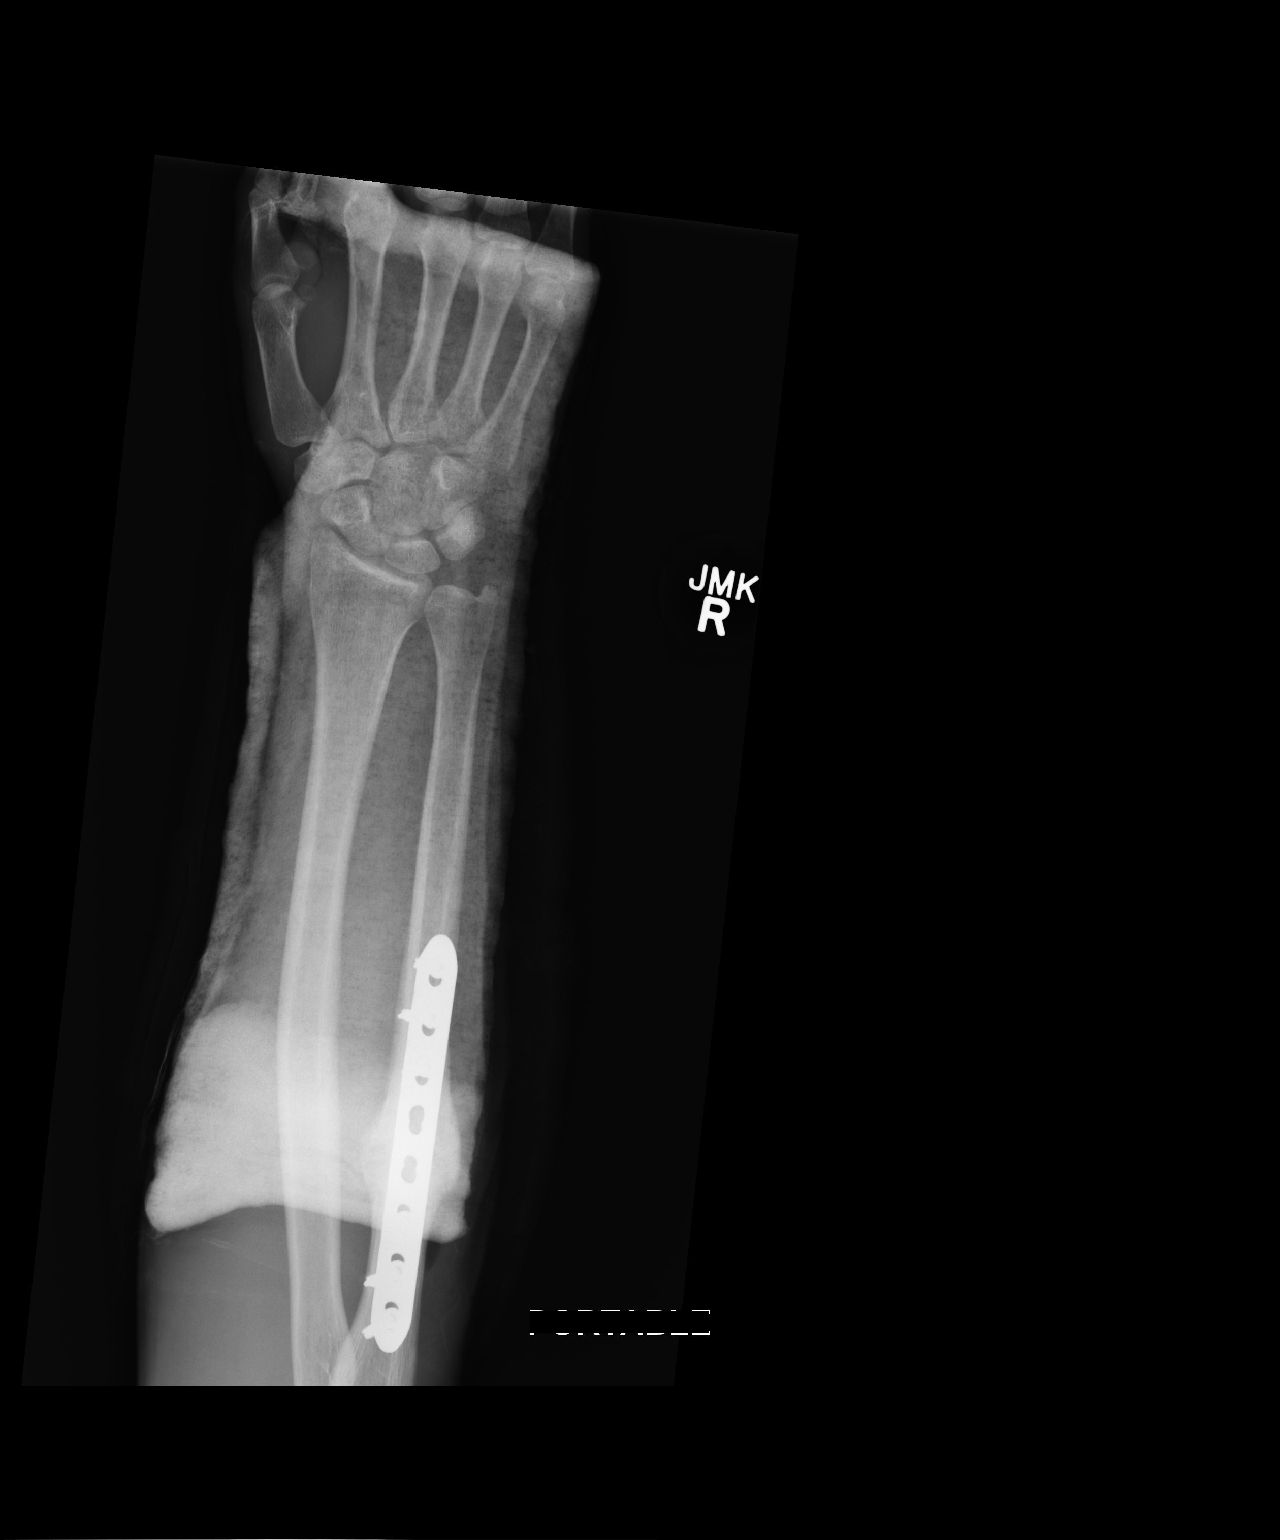

[lateral]
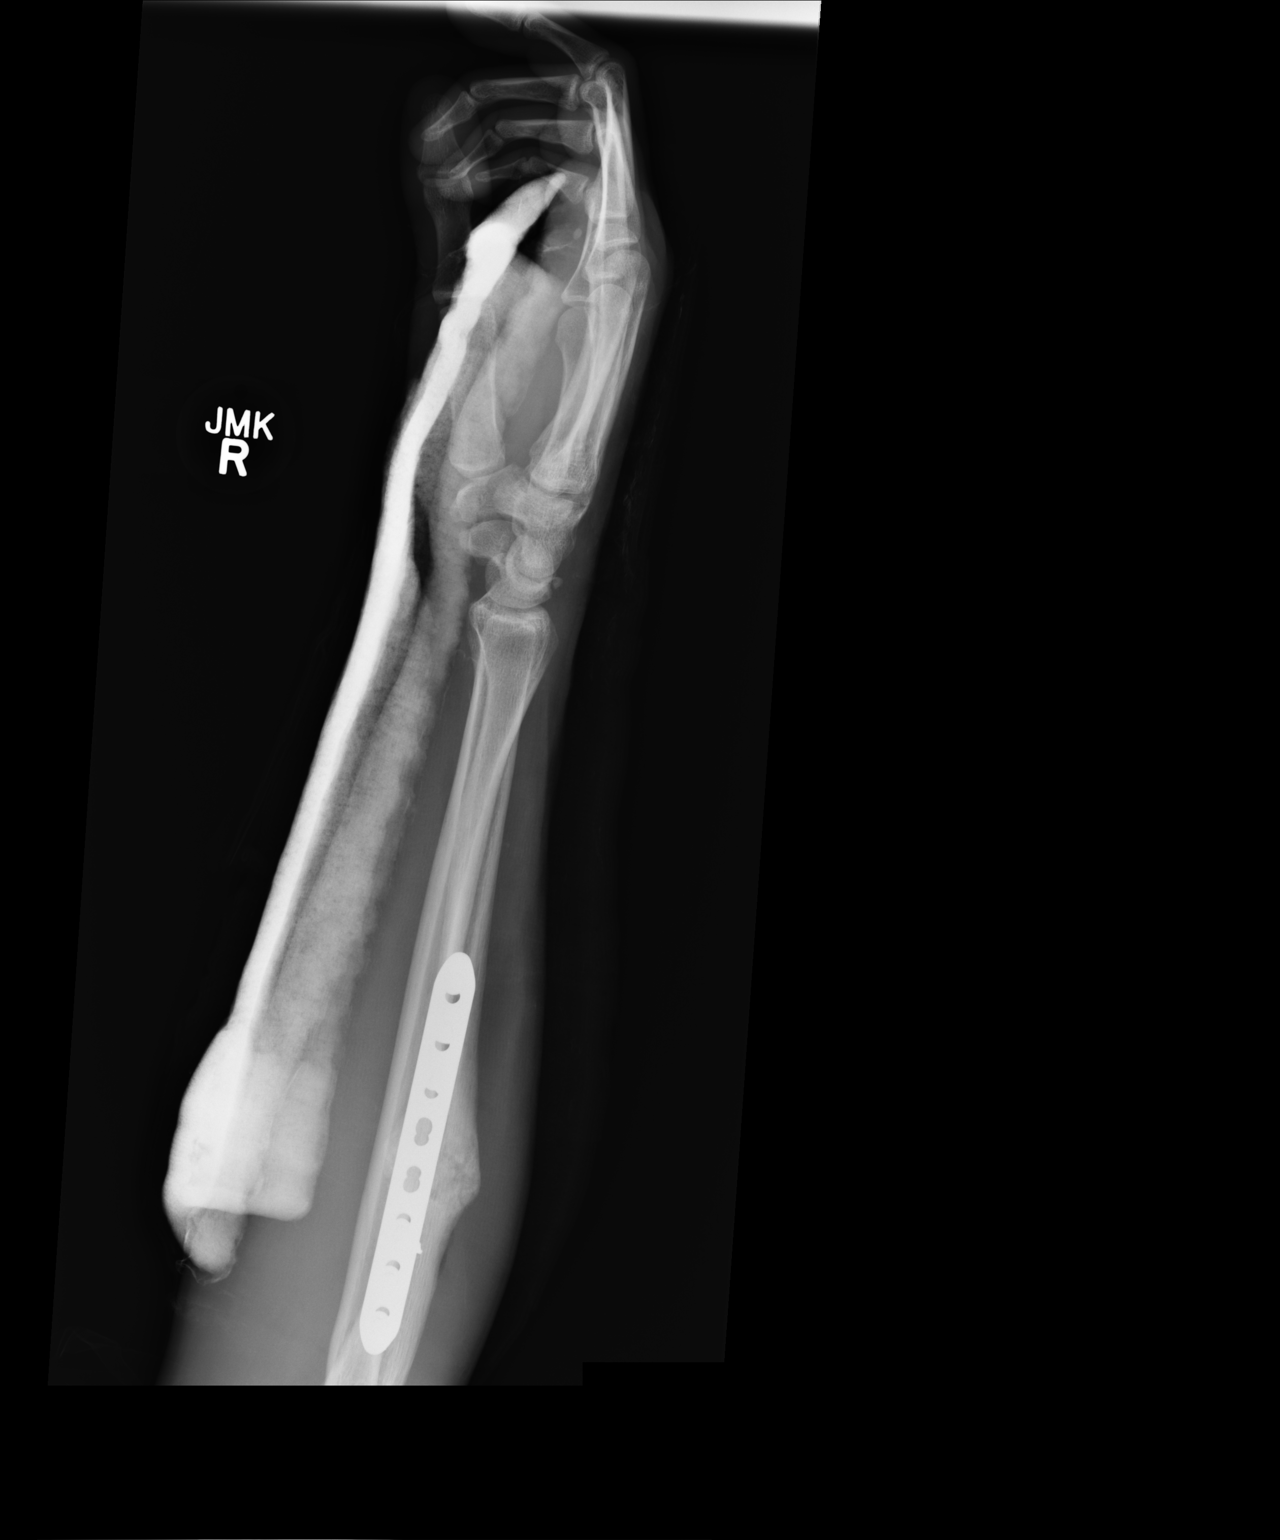

[2 of 2 positions shown; findings below may reference images not displayed]

FINDINGS: Short forearm splint.

Metal plate and multiple screws identified at the proximal to mid
RIGHT ulna post ORIF of a subacute diaphyseal fracture.

Periosteal new bone and subacute margins are present.

No other focal bony abnormalities.

Osseous mineralization otherwise normal.
IMPRESSION: Post ORIF of a subacute proximal to mid RIGHT ulnar diaphyseal
fracture.

## 2016-03-19 IMAGING — DX DG FOREARM 2V*R*
2 series · 2 of 2 positions shown · non-contrast
Comparison: 01/30/2015

CLINICAL DATA: Status post ORIF of ulnar fracture

EXAM:
RIGHT FOREARM - 2 VIEW

[forearm ap]
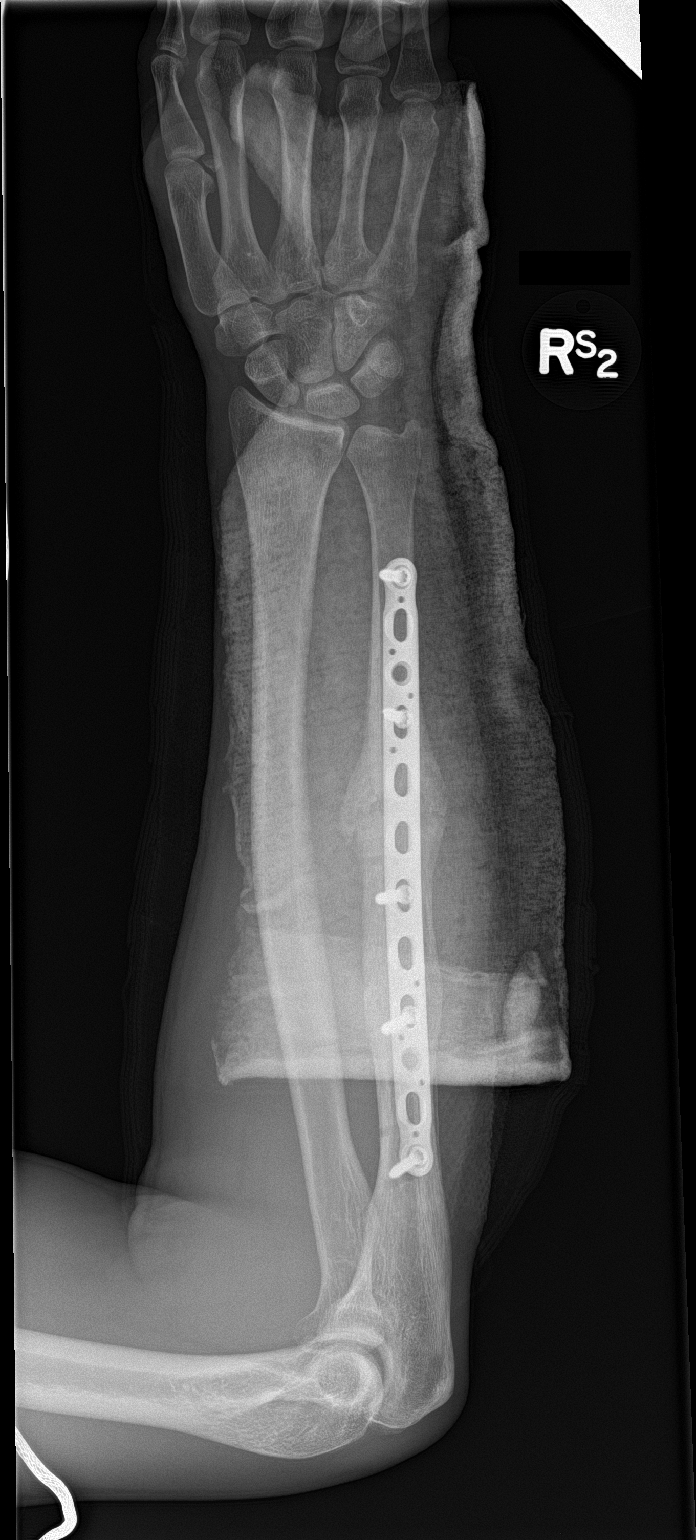

[forearm lat]
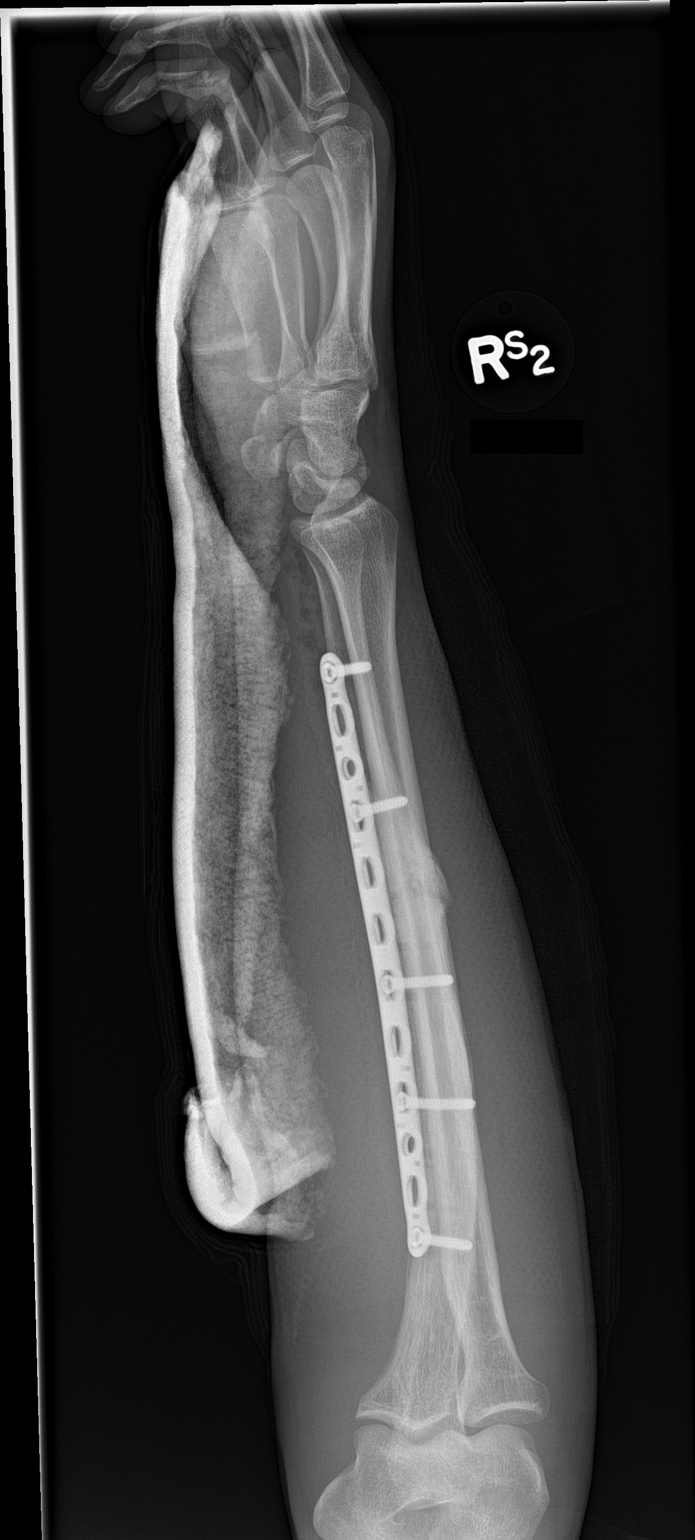

[2 of 2 positions shown; findings below may reference images not displayed]

FINDINGS: A fixation plate is seen traversing the mid ulna. Fracture fragments
are in anatomic alignment. No acute abnormality is noted. Callus
formation of previous fracture site is seen.
IMPRESSION: Status post ORIF of the ulnar fracture.

## 2018-03-08 ENCOUNTER — Encounter: Payer: Self-pay | Admitting: Psychiatry

## 2018-03-08 ENCOUNTER — Ambulatory Visit (INDEPENDENT_AMBULATORY_CARE_PROVIDER_SITE_OTHER): Payer: 59 | Admitting: Psychiatry

## 2018-03-08 DIAGNOSIS — F4310 Post-traumatic stress disorder, unspecified: Secondary | ICD-10-CM | POA: Diagnosis not present

## 2018-03-08 NOTE — Progress Notes (Signed)
      [facility] Counselor/Therapist Progress Note  Patient ID: Craig Norman, MRN: [id number],    Date: 03/08/2018  Time Spent: 52 minutes  Treatment Type: Individual Therapy  Reported Symptoms: Anxious Mood  Mental Status Exam:  Appearance:   Casual     Behavior:  Appropriate  Motor:  Normal  Speech/Language:   Normal Rate  Affect:  Appropriate  Mood:  normal  Thought process:  normal  Thought content:    WNL  Sensory/Perceptual disturbances:    WNL  Orientation:  oriented to person, place and time/date  Attention:  Good  Concentration:  Good  Memory:  WNL  Fund of knowledge:   Good  Insight:    Good  Judgment:   Good  Impulse Control:  Good   Risk Assessment: Danger to Self:  No Self-injurious Behavior: No Danger to Others: No Duty to Warn:no Physical Aggression / Violence:No  Access to Firearms a concern: No  Gang Involvement:No   Subjective: Patient was present for session.  Patient reported that school in [place] is going well.  He reported there have been a couple incidents that have triggered past traumas for him.  Worked on 1 of the pictures using E MDR.  Picture brother saying you are the way you are because of me, suds level 7, negative cognition "I am not in control", felt numb in his throat.  Patient was able to reduce as level to 2.  He was able to realize that he is a person if he is despite his brother.  Ways for patient to continue affirming himself or discussed in session.  Patient was also concerned about something with his sisters.  Discussed ways to handle that problem with his father.  Patient went on to share some other positive things that have occurred while he has been away at school.  Agreed to do 1 more session [place].  Interventions: Solution-Oriented/Positive Psychology and Eye Movement Desensitization and Reprocessing (EMDR)  Diagnosis:   ICD-10-CM   1. PTSD (post-traumatic stress disorder) F43.10      Plan: 1.  Patient to continue to engage in individual counseling 2-4 times a month or as needed. 2.  Patient to identify and apply CBT, coping skills learned in session to decrease triggered responses and anxiety symptoms. 3.  Patient to contact this office, go to the local ED or call 911 if a crisis or emergency develops between visits.  Stevphen MeuseHolly Clairessa Boulet, [place]

## 2018-03-16 ENCOUNTER — Ambulatory Visit: Payer: Self-pay | Admitting: Psychiatry

## 2018-09-09 DIAGNOSIS — Z79899 Other long term (current) drug therapy: Secondary | ICD-10-CM | POA: Diagnosis not present

## 2018-09-09 DIAGNOSIS — F988 Other specified behavioral and emotional disorders with onset usually occurring in childhood and adolescence: Secondary | ICD-10-CM | POA: Diagnosis not present

## 2018-09-09 DIAGNOSIS — Z6824 Body mass index (BMI) 24.0-24.9, adult: Secondary | ICD-10-CM | POA: Diagnosis not present

## 2018-09-09 DIAGNOSIS — F324 Major depressive disorder, single episode, in partial remission: Secondary | ICD-10-CM | POA: Diagnosis not present

## 2018-10-04 ENCOUNTER — Telehealth: Payer: Self-pay | Admitting: Psychiatry

## 2018-10-04 NOTE — Telephone Encounter (Signed)
Craig Norman said he was returning our call from Friday.  He is not taking Trintellix so there is no need to send in a prescription.

## 2018-10-04 NOTE — Telephone Encounter (Signed)
Will make note, office staff was trying to see if pt needed a refill on his Buspar, pt stated [place] was [place] but didn't leave a pharmacy. Pt did not clarify they he needed anything at this time.

## 2018-10-18 DIAGNOSIS — Z6823 Body mass index (BMI) 23.0-23.9, adult: Secondary | ICD-10-CM | POA: Diagnosis not present

## 2018-10-18 DIAGNOSIS — R748 Abnormal levels of other serum enzymes: Secondary | ICD-10-CM | POA: Diagnosis not present

## 2018-10-18 DIAGNOSIS — F988 Other specified behavioral and emotional disorders with onset usually occurring in childhood and adolescence: Secondary | ICD-10-CM | POA: Diagnosis not present
